# Patient Record
Sex: Male | Born: 1995 | Race: White | Hispanic: No | Marital: Single | Smoking: Never smoker
Health system: Southern US, Community
[De-identification: ages and names within clinical notes are randomized; demographics above are authoritative.]

---
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
## 2015-09-26 ENCOUNTER — Encounter: Payer: 59 | Admitting: Sports Medicine

## 2016-11-03 MED FILL — PROMETHAZINE 25 MG TABLET: 25 | 2 days supply | Qty: 10 | Fill #0

## 2016-11-03 MED FILL — HYDROCODON-APAP 7.5-325: 7.5-325 | 3 days supply | Qty: 20 | Fill #0

## 2016-12-23 ENCOUNTER — Ambulatory Visit (HOSPITAL_COMMUNITY)
Admission: EM | Admit: 2016-12-23 | Discharge: 2016-12-23 | Disposition: A | Discharge status: Home / Self Care | Payer: Worker's Compensation | Attending: Family Medicine | Admitting: Family Medicine

## 2016-12-23 ENCOUNTER — Ambulatory Visit (INDEPENDENT_AMBULATORY_CARE_PROVIDER_SITE_OTHER): Payer: Worker's Compensation

## 2016-12-23 ENCOUNTER — Encounter (HOSPITAL_COMMUNITY): Payer: Self-pay | Admitting: Family Medicine

## 2016-12-23 DIAGNOSIS — S59912A Unspecified injury of left forearm, initial encounter: Secondary | ICD-10-CM

## 2016-12-23 NOTE — ED Triage Notes (Signed)
Pt here for injury to left wrist after a pan fell on it last night. Slight swelling. Hx of fracture in wrist.

## 2016-12-29 NOTE — ED Provider Notes (Signed)
  [facility]   [id number] 12/23/16 Arrival Time: 1630  ASSESSMENT & PLAN:  1. Forearm injury, left, initial encounter    No fracture seen. Continue ice as needed along with ibuprofen. He feels he can do his normal duties at work. Just wanted to make sure nothing was broken. Work note given. May f/u as needed.  Reviewed expectations re: course of current medical issues. Questions answered. Outlined signs and symptoms indicating need for more acute intervention. Patient verbalized understanding. After Visit Summary given.   SUBJECTIVE:  Andrew Woodward is a 21 y.o. male who reports:  Wrist Pain: Patient complaints of left wrist pain. 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 The pain began 1 day ago after falling last evening at work on outstretched hand. The pain is located primarily in the radial area.  He describes the symptoms as aching. Symptoms improve with ice. The symptoms are worse with movement. Treatment to date has been ice, with moderate relief.  ROS: As per HPI.   OBJECTIVE:  Vitals:   12/23/16 1706  BP: 136/85  Pulse: 75  Resp: 18  SpO2: 96%    General appearance: alert; no distress Extremities: no cyanosis or edema; symmetrical with no gross deformities; tenderness over his left radial wrist with no swelling and no bruising; FROM CV: normal extremity capillary refill Skin: warm and dry Neurologic: normal gait; normal symmetric reflexes in all extremities; normal sensation Psychological: alert and cooperative; normal mood and affect  Imaging: Dg Forearm Left  Result Date: 12/23/2016 CLINICAL DATA:  Hit in left wrist with object. EXAM: LEFT FOREARM - 2 VIEW COMPARISON:  None. FINDINGS: There is no evidence of fracture or other focal bone lesions. Soft tissues are unremarkable. IMPRESSION: Negative. Electronically Signed   By: Charlett Nose M.D.   On: 12/23/2016 17:42    No Known Allergies   Social History   Social History    . Marital status: Single    Spouse name: N/A  . Number of children: N/A  . Years of education: N/A   Occupational History  . Not on file.   Social History Main Topics  . Smoking status: Never Smoker  . Smokeless tobacco: Current User  . Alcohol use No  . Drug use: No  . Sexual activity: Not on file   Other Topics Concern  . Not on file   Social History Narrative  . No narrative on file   Family History  Problem Relation Age of Onset  . Cancer Paternal Uncle   . Hypertension Maternal Grandfather   . Hyperlipidemia Paternal Grandfather   . Hypertension Paternal Grandfather   . Heart attack Paternal Glynda Jaeger, MD 12/29/16 [phone]

## 2018-05-18 DIAGNOSIS — F321 Major depressive disorder, single episode, moderate: Secondary | ICD-10-CM | POA: Diagnosis not present

## 2018-06-08 DIAGNOSIS — F324 Major depressive disorder, single episode, in partial remission: Secondary | ICD-10-CM | POA: Diagnosis not present

## 2018-06-22 IMAGING — DX DG FOREARM 2V*L*
2 series · 2 of 2 positions shown · non-contrast
Comparison: None.

CLINICAL DATA: Hit in left wrist with object.

EXAM:
LEFT FOREARM - 2 VIEW

[forearm ap]
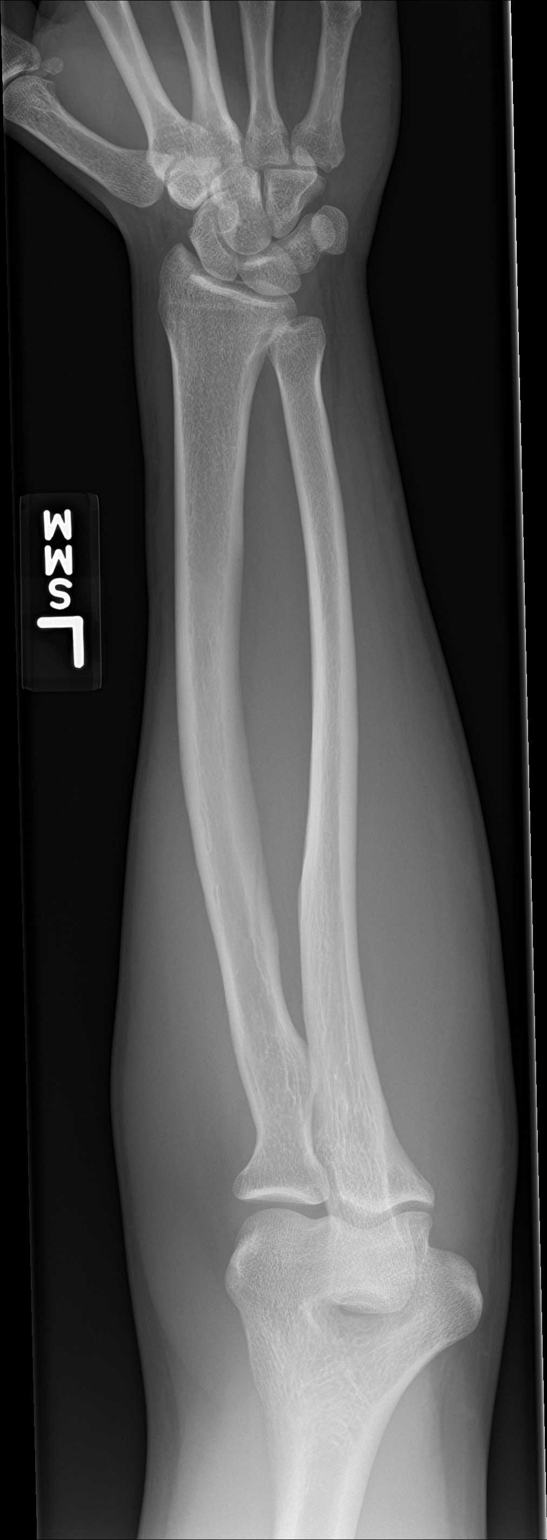

[forearm lat]
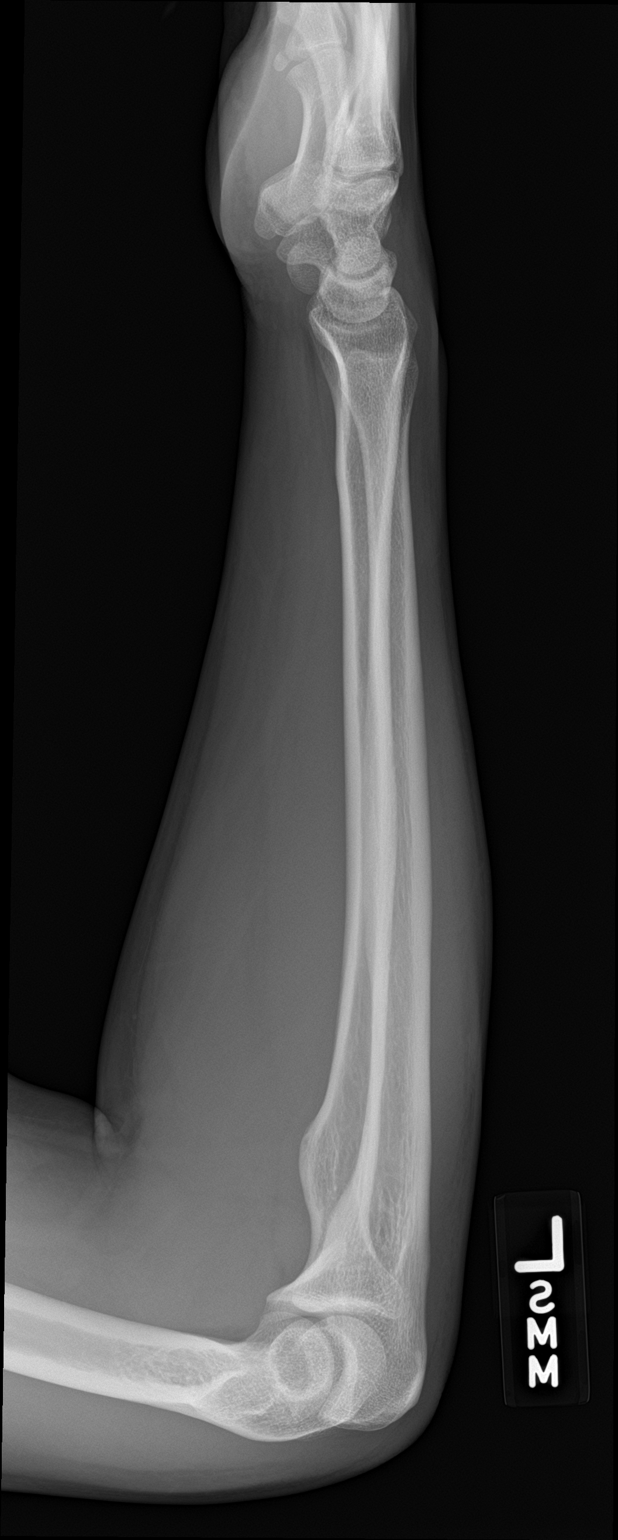

[2 of 2 positions shown; findings below may reference images not displayed]

FINDINGS: There is no evidence of fracture or other focal bone lesions. Soft
tissues are unremarkable.
IMPRESSION: Negative.

## 2018-07-27 MED FILL — FLUoxetine HCL 20 MG CAPS: 20 | 30 days supply | Qty: 60 | Fill #0

## 2018-08-12 DIAGNOSIS — H5213 Myopia, bilateral: Secondary | ICD-10-CM | POA: Diagnosis not present

## 2018-08-26 MED FILL — FLUoxetine HCL 20 MG CAPS: 20 | 30 days supply | Qty: 60 | Fill #1

## 2018-10-03 MED FILL — FLUoxetine HCL 20 MG CAPS: 20 | 30 days supply | Qty: 60 | Fill #2

## 2018-11-15 MED FILL — FLUoxetine HCL 20 MG CAPS: 20 | 30 days supply | Qty: 60 | Fill #3

## 2018-12-22 MED FILL — FLUoxetine HCL 20 MG CAPS: 20 | 30 days supply | Qty: 60 | Fill #4

## 2019-04-19 MED FILL — FLUoxetine HCL 20 MG CAPS: 20 | 30 days supply | Qty: 60 | Fill #0

## 2019-05-22 MED FILL — FLUoxetine HCL 20 MG CAPS: 20 | 30 days supply | Qty: 60 | Fill #1

## 2019-06-15 MED FILL — FLUoxetine HCL 20 MG CAPS: 20 | 30 days supply | Qty: 60 | Fill #2

## 2019-08-02 MED FILL — FLUoxetine HCL 20 MG CAPS: 20 | 30 days supply | Qty: 60 | Fill #3

## 2019-08-22 DIAGNOSIS — F418 Other specified anxiety disorders: Secondary | ICD-10-CM | POA: Diagnosis not present

## 2019-08-22 DIAGNOSIS — Z1331 Encounter for screening for depression: Secondary | ICD-10-CM | POA: Diagnosis not present

## 2019-08-22 DIAGNOSIS — Z0000 Encounter for general adult medical examination without abnormal findings: Secondary | ICD-10-CM | POA: Diagnosis not present

## 2019-11-09 DIAGNOSIS — S61412A Laceration without foreign body of left hand, initial encounter: Secondary | ICD-10-CM | POA: Diagnosis not present

## 2019-11-30 DIAGNOSIS — Z20822 Contact with and (suspected) exposure to covid-19: Secondary | ICD-10-CM | POA: Diagnosis not present

## 2019-11-30 DIAGNOSIS — Z1152 Encounter for screening for COVID-19: Secondary | ICD-10-CM | POA: Diagnosis not present

## 2020-02-23 DIAGNOSIS — Z23 Encounter for immunization: Secondary | ICD-10-CM | POA: Diagnosis not present

## 2020-02-23 DIAGNOSIS — Z1159 Encounter for screening for other viral diseases: Secondary | ICD-10-CM | POA: Diagnosis not present

## 2020-02-23 DIAGNOSIS — F418 Other specified anxiety disorders: Secondary | ICD-10-CM | POA: Diagnosis not present

## 2020-02-23 DIAGNOSIS — E7800 Pure hypercholesterolemia, unspecified: Secondary | ICD-10-CM | POA: Diagnosis not present

## 2020-10-30 DIAGNOSIS — U071 COVID-19: Secondary | ICD-10-CM | POA: Diagnosis not present

## 2020-10-30 DIAGNOSIS — R519 Headache, unspecified: Secondary | ICD-10-CM | POA: Diagnosis not present

## 2020-10-30 DIAGNOSIS — R509 Fever, unspecified: Secondary | ICD-10-CM | POA: Diagnosis not present
